# Patient Record
Sex: Female | Born: 1975 | Race: White | Hispanic: No | Smoking: Never smoker
Health system: Southern US, Community
[De-identification: ages and names within clinical notes are randomized; demographics above are authoritative.]

## PROBLEM LIST (undated history)

## (undated) DIAGNOSIS — R569 Unspecified convulsions: Secondary | ICD-10-CM

## (undated) DIAGNOSIS — G43909 Migraine, unspecified, not intractable, without status migrainosus: Secondary | ICD-10-CM

## (undated) DIAGNOSIS — D649 Anemia, unspecified: Secondary | ICD-10-CM

## (undated) HISTORY — PX: MANDIBLE FRACTURE SURGERY: SHX706

---

## 2019-11-08 ENCOUNTER — Encounter (HOSPITAL_COMMUNITY): Payer: Self-pay

## 2019-11-08 ENCOUNTER — Emergency Department (HOSPITAL_COMMUNITY): Payer: Medicaid Other

## 2019-11-08 ENCOUNTER — Other Ambulatory Visit: Payer: Self-pay

## 2019-11-08 ENCOUNTER — Inpatient Hospital Stay (HOSPITAL_COMMUNITY)
Admission: EM | Admit: 2019-11-08 | Discharge: 2019-11-10 | DRG: 812 | Disposition: A | Discharge status: Home / Self Care | Payer: Medicaid Other | Attending: Internal Medicine | Admitting: Internal Medicine

## 2019-11-08 DIAGNOSIS — R0789 Other chest pain: Secondary | ICD-10-CM | POA: Diagnosis present

## 2019-11-08 DIAGNOSIS — A5901 Trichomonal vulvovaginitis: Secondary | ICD-10-CM | POA: Diagnosis present

## 2019-11-08 DIAGNOSIS — R350 Frequency of micturition: Secondary | ICD-10-CM | POA: Diagnosis present

## 2019-11-08 DIAGNOSIS — D649 Anemia, unspecified: Secondary | ICD-10-CM | POA: Diagnosis not present

## 2019-11-08 DIAGNOSIS — G8929 Other chronic pain: Secondary | ICD-10-CM | POA: Diagnosis present

## 2019-11-08 DIAGNOSIS — Z79891 Long term (current) use of opiate analgesic: Secondary | ICD-10-CM

## 2019-11-08 DIAGNOSIS — Z6834 Body mass index (BMI) 34.0-34.9, adult: Secondary | ICD-10-CM

## 2019-11-08 DIAGNOSIS — E669 Obesity, unspecified: Secondary | ICD-10-CM | POA: Diagnosis present

## 2019-11-08 DIAGNOSIS — D500 Iron deficiency anemia secondary to blood loss (chronic): Principal | ICD-10-CM | POA: Diagnosis present

## 2019-11-08 DIAGNOSIS — R040 Epistaxis: Secondary | ICD-10-CM | POA: Diagnosis present

## 2019-11-08 DIAGNOSIS — G43909 Migraine, unspecified, not intractable, without status migrainosus: Secondary | ICD-10-CM | POA: Diagnosis present

## 2019-11-08 DIAGNOSIS — N920 Excessive and frequent menstruation with regular cycle: Secondary | ICD-10-CM

## 2019-11-08 DIAGNOSIS — Z20828 Contact with and (suspected) exposure to other viral communicable diseases: Secondary | ICD-10-CM | POA: Diagnosis present

## 2019-11-08 HISTORY — DX: Migraine, unspecified, not intractable, without status migrainosus: G43.909

## 2019-11-08 HISTORY — DX: Anemia, unspecified: D64.9

## 2019-11-08 HISTORY — DX: Unspecified convulsions: R56.9

## 2019-11-08 LAB — CBC WITH DIFFERENTIAL/PLATELET
Abs Immature Granulocytes: 0.05 10*3/uL (ref 0.00–0.07)
Basophils Absolute: 0 10*3/uL (ref 0.0–0.1)
Basophils Relative: 0 %
Eosinophils Absolute: 0.1 10*3/uL (ref 0.0–0.5)
Eosinophils Relative: 2 %
HCT: 22.4 % — ABNORMAL LOW (ref 36.0–46.0)
Hemoglobin: 6.2 g/dL — CL (ref 12.0–15.0)
Immature Granulocytes: 1 %
Lymphocytes Relative: 31 %
Lymphs Abs: 1.5 10*3/uL (ref 0.7–4.0)
MCH: 20.3 pg — ABNORMAL LOW (ref 26.0–34.0)
MCHC: 27.7 g/dL — ABNORMAL LOW (ref 30.0–36.0)
MCV: 73.4 fL — ABNORMAL LOW (ref 80.0–100.0)
Monocytes Absolute: 0.4 10*3/uL (ref 0.1–1.0)
Monocytes Relative: 9 %
Neutro Abs: 2.7 10*3/uL (ref 1.7–7.7)
Neutrophils Relative %: 57 %
Platelets: 143 10*3/uL — ABNORMAL LOW (ref 150–400)
RBC: 3.05 MIL/uL — ABNORMAL LOW (ref 3.87–5.11)
RDW: 17.7 % — ABNORMAL HIGH (ref 11.5–15.5)
WBC: 4.8 10*3/uL (ref 4.0–10.5)
nRBC: 0 % (ref 0.0–0.2)

## 2019-11-08 LAB — URINALYSIS, ROUTINE W REFLEX MICROSCOPIC
Bilirubin Urine: NEGATIVE
Glucose, UA: NEGATIVE mg/dL
Hgb urine dipstick: NEGATIVE
Ketones, ur: NEGATIVE mg/dL
Nitrite: NEGATIVE
Protein, ur: NEGATIVE mg/dL
Specific Gravity, Urine: 1.012 (ref 1.005–1.030)
pH: 5 (ref 5.0–8.0)

## 2019-11-08 LAB — COMPREHENSIVE METABOLIC PANEL
ALT: 27 U/L (ref 0–44)
AST: 38 U/L (ref 15–41)
Albumin: 3.9 g/dL (ref 3.5–5.0)
Alkaline Phosphatase: 56 U/L (ref 38–126)
Anion gap: 10 (ref 5–15)
BUN: 9 mg/dL (ref 6–20)
CO2: 29 mmol/L (ref 22–32)
Calcium: 9.1 mg/dL (ref 8.9–10.3)
Chloride: 101 mmol/L (ref 98–111)
Creatinine, Ser: 0.79 mg/dL (ref 0.44–1.00)
GFR calc Af Amer: >60 mL/min (ref >60)
GFR calc non Af Amer: >60 mL/min (ref >60)
Glucose, Bld: 93 mg/dL (ref 70–99)
Potassium: 3.7 mmol/L (ref 3.5–5.1)
Sodium: 140 mmol/L (ref 135–145)
Total Bilirubin: 0.7 mg/dL (ref 0.3–1.2)
Total Protein: 7.3 g/dL (ref 6.5–8.1)

## 2019-11-08 LAB — POC URINE PREG, ED: Preg Test, Ur: NEGATIVE

## 2019-11-08 LAB — WET PREP, GENITAL
Clue Cells Wet Prep HPF POC: NONE SEEN
Sperm: NONE SEEN
Yeast Wet Prep HPF POC: NONE SEEN

## 2019-11-08 LAB — RETICULOCYTES
Immature Retic Fract: 32.7 % — ABNORMAL HIGH (ref 2.3–15.9)
RBC.: 3.04 MIL/uL — ABNORMAL LOW (ref 3.87–5.11)
Retic Count, Absolute: 84.5 10*3/uL (ref 19.0–186.0)
Retic Ct Pct: 2.8 % (ref 0.4–3.1)

## 2019-11-08 LAB — LIPASE, BLOOD: Lipase: 32 U/L (ref 11–51)

## 2019-11-08 MED ORDER — SODIUM CHLORIDE 0.9 % IV SOLN
10.0000 mL/h | Freq: Once | INTRAVENOUS | Status: AC
  Administered 2019-11-09: 10 mL/h via INTRAVENOUS

## 2019-11-08 MED ORDER — METRONIDAZOLE 500 MG PO TABS
2000.0000 mg | ORAL_TABLET | Freq: Once | ORAL | Status: DC
  Filled 2019-11-08: qty 4

## 2019-11-08 MED ORDER — ACETAMINOPHEN 325 MG PO TABS
650.0000 mg | ORAL_TABLET | Freq: Once | ORAL | Status: AC
  Administered 2019-11-08: 650 mg via ORAL
  Filled 2019-11-08: qty 2

## 2019-11-08 NOTE — ED Notes (Signed)
Patient verbally aggressive during IV start and during collection of COVID specimen.

## 2019-11-08 NOTE — ED Provider Notes (Signed)
[facility]-EMERGENCY DEPT Provider Note   CSN: [id number] Arrival date & time: 11/08/19  1518     History Chief Complaint  Patient presents with  . Flank Pain  . Headache  . urinary frequency  . Dysuria    Stacy Fleming is a 43 y.o. female with a past medical history of anemia, migraines, chronic pain on opioids, who presents today for evaluation of multiple complaints. Her primary complaints are chest pain/pressure and foul vaginal odors.  She reports that she has been under increased stress the past week as she just came to [facility] to get out of a bad situation.  She states that she has been going to the methadone clinic instead of taking Percocets.  She states that she does not inject any medications.  She states that earlier today she had an episode of chest pressure where she felt like she could not breathe.  She does admit to feeling anxious.  She denies any associated diaphoresis, nausea, or vomiting.  She does not use any hormones, denies leg swelling.  No personal history of PE/DVT.  No hemoptysis.  She reports that currently she feels better and that her symptoms have resolved.  She reports that over the past week she has had foul vaginal odors with abnormal vaginal bleeding.  She is requesting a pelvic exam.  She reports she has had pelvic pain.  She also reports bilateral lower back pain with dysuria, increased frequency.  She had also mentioned nosebleeds nightly for the past 2 nights and migraine headaches over the past week to triage, however these are not her primary concerns today and she would rather have the chest pain and vaginal discharge addressed.     She notes that in the spring she tested positive for coronavirus and says this feels different.    HPI     Past Medical History:  Diagnosis Date  . Anemia   . Migraine   . Seizures (HCC)     There are no problems to display for this patient.   Past Surgical History:  Procedure  Laterality Date  . MANDIBLE FRACTURE SURGERY       OB History   No obstetric history on file.     Family History  Problem Relation Age of Onset  . Chronic Renal Failure Mother     Social History   Tobacco Use  . Smoking status: Never Smoker  . Smokeless tobacco: Never Used  Substance Use Topics  . Alcohol use: Yes  . Drug use: Never    Home Medications Prior to Admission medications   Not on File    Allergies    Patient has no known allergies.  Review of Systems   Review of Systems  Constitutional: Positive for fatigue. Negative for chills and fever.  HENT: Positive for congestion, nosebleeds and postnasal drip.   Eyes: Negative for photophobia, pain and visual disturbance.  Respiratory: Positive for chest tightness and shortness of breath.   Cardiovascular: Positive for chest pain. Negative for palpitations and leg swelling.  Gastrointestinal: Negative for diarrhea, nausea and vomiting.  Genitourinary: Positive for dysuria, menstrual problem, pelvic pain, urgency, vaginal bleeding, vaginal discharge and vaginal pain.  Musculoskeletal: Positive for back pain.  Skin: Negative for color change, rash and wound.  Neurological: Positive for headaches. Negative for weakness.  Psychiatric/Behavioral: Negative for confusion.  All other systems reviewed and are negative.   Physical Exam Updated Vital Signs BP (!) 136/45 (BP Location: Left Arm)   Pulse 79  Temp 98 F (36.7 C) (Oral)   Resp 18   Ht 5\' 2"  (1.575 m)   Wt 84.8 kg   LMP 10/25/2019   SpO2 100%   BMI 34.20 kg/m   Physical Exam Vitals and nursing note reviewed. Exam conducted with a chaperone present (Female ED tech).  Constitutional:      General: She is not in acute distress.    Appearance: She is well-developed. She is not diaphoretic.  HENT:     Head: Normocephalic and atraumatic.     Mouth/Throat:     Mouth: Mucous membranes are moist.  Eyes:     General: No scleral icterus.       Right  eye: No discharge.        Left eye: No discharge.     Conjunctiva/sclera: Conjunctivae normal.  Cardiovascular:     Rate and Rhythm: Normal rate and regular rhythm.     Pulses: Normal pulses.     Heart sounds: Normal heart sounds.  Pulmonary:     Effort: Pulmonary effort is normal. No respiratory distress.     Breath sounds: Normal breath sounds. No stridor.  Abdominal:     General: There is no distension.     Tenderness: There is no abdominal tenderness.  Genitourinary:    Comments: Normal external female genitalia.  There is copious amounts of watery discharge in the vaginal canal without evidence of bleeding.  No cervical motion tenderness or significant adnexal tenderness or fullness. Musculoskeletal:        General: No deformity.     Cervical back: Normal range of motion and neck supple.     Right lower leg: No edema.     Left lower leg: No edema.  Skin:    General: Skin is warm and dry.  Neurological:     General: No focal deficit present.     Mental Status: She is alert.     Cranial Nerves: No cranial nerve deficit.     Motor: No abnormal muscle tone.  Psychiatric:        Behavior: Behavior normal.     Comments: Very anxious     ED Results / Procedures / Treatments   Labs (all labs ordered are listed, but only abnormal results are displayed) Labs Reviewed  WET PREP, GENITAL - Abnormal; Notable for the following components:      Result Value   Trich, Wet Prep PRESENT (*)    WBC, Wet Prep HPF POC MANY (*)    All other components within normal limits  URINALYSIS, ROUTINE W REFLEX MICROSCOPIC - Abnormal; Notable for the following components:   APPearance HAZY (*)    Leukocytes,Ua LARGE (*)    Bacteria, UA RARE (*)    Crystals PRESENT (*)    All other components within normal limits  CBC WITH DIFFERENTIAL/PLATELET - Abnormal; Notable for the following components:   RBC 3.05 (*)    Hemoglobin 6.2 (*)    HCT 22.4 (*)    MCV 73.4 (*)    MCH 20.3 (*)    MCHC 27.7  (*)    RDW 17.7 (*)    Platelets 143 (*)    All other components within normal limits  RETICULOCYTES - Abnormal; Notable for the following components:   RBC. 3.04 (*)    Immature Retic Fract 32.7 (*)    All other components within normal limits  SARS CORONAVIRUS 2 (TAT 6-24 HRS)  COMPREHENSIVE METABOLIC PANEL  LIPASE, BLOOD  VITAMIN B12  FOLATE  IRON AND TIBC  FERRITIN  POC URINE PREG, ED  PREPARE RBC (CROSSMATCH)  GC/CHLAMYDIA PROBE AMP ([facility]) NOT AT [facility]    EKG EKG Interpretation  Date/Time:  Sunday November 08 2019 21:10:37 EST Ventricular Rate:  72 PR Interval:    QRS Duration: 84 QT Interval:  427 QTC Calculation: 468 R Axis:   67 Text Interpretation: Sinus rhythm No old tracing to compare Confirmed by Mancel Bale [phone]) on 11/08/2019 11:23:03 PM   Radiology DG Chest 2 View  Result Date: 11/08/2019 CLINICAL DATA:  Pt is here for RUQ chest pressure x 2 weeks, also c/o some left flank pain and dysuria. Nonsmoker. Pt had Covid x 3 months ago. EXAM: CHEST - 2 VIEW COMPARISON:  None. FINDINGS: The cardiomediastinal contours are within normal limits. Small linear opacity in the left mid lung likely reflects atelectasis or scarring. No other focal infiltrate. No pneumothorax or pleural effusion. No acute finding in the visualized skeleton. IMPRESSION: No evidence of active disease in the chest. Minimal left lung atelectasis/scarring. Electronically Signed   By: Emmaline Kluver M.D.   On: 11/08/2019 21:07    Procedures .Critical Care Performed by: Cristina Gong, PA-C Authorized by: Cristina Gong, PA-C   Critical care provider statement:    Critical care time (minutes):  45   Critical care was necessary to treat or prevent imminent or life-threatening deterioration of the following conditions:  Circulatory failure   Critical care was time spent personally by me on the following activities:  Discussions with consultants, evaluation of patient's  response to treatment, examination of patient, ordering and performing treatments and interventions, ordering and review of laboratory studies, ordering and review of radiographic studies, pulse oximetry, re-evaluation of patient's condition, obtaining history from patient or surrogate and review of old charts Comments:     Symptomatic anemia.  Blood transfusion ordered.   (including critical care time)  Medications Ordered in ED Medications  0.9 %  sodium chloride infusion (0 mL/hr Intravenous Hold 11/08/19 2256)  metroNIDAZOLE (FLAGYL) tablet 2,000 mg (has no administration in time range)  2 units RBC  ED Course  I have reviewed the triage vital signs and the nursing notes.  Pertinent labs & imaging results that were available during my care of the patient were reviewed by me and considered in my medical decision making (see chart for details).  Clinical Course as of Nov 07 2325  Wynelle Link Nov 08, 2019  2144 Blood ordered  Hemoglobin(!!): 6.2 [EH]  2308 Leroy Libman NP reports get ultrasound, if not bleeding would not do anything and can consult formally in the morning if requested.    [EH]    Clinical Course User Index [EH] Norman Clay   MDM Rules/Calculators/A&P                      Patient presents today for evaluation of multiple complaints including resolved chest pain/pressure, foul vaginal odors, nosebleeds and headaches.  Her primary concerns today are the foul vaginal odor/discharge along with the chest pain/pressure.  EKG obtained without evidence of ischemia, based on history low suspicion for ACS.  She has previously been diagnosed with Covid and recovered therefore very low suspicion for coronavirus.  Labs are obtained and reviewed, CBC is significant for hemoglobin is 6.2.  Given that she had chest pain earlier she meets criteria for symptomatic anemia.  We discussed role of blood transfusion and she states her understanding.  Blood transfusion was ordered.  CMP without significant abnormalities.  Wet prep is positive for trichomoniasis.  We discussed that this is a sexually transmitted infection and that partners will need to be treated.  She is given a one-time dose of 2 g of Flagyl for treatment after she denied any alcohol use in the past 24 hours.  I spoke with OB/GYN who states that as patient does not have blood in the vaginal canal and is not appear to be actively bleeding, while this may be related to her heavy periods, the only intervention they would recommend is an ultrasound and outpatient follow-up.  I spoke with hospitalist Dr. Posey Pronto who agreed to admit patient.  Patient remained hemodynamically stable while in my care. Final Clinical Impression(s) / ED Diagnoses Final diagnoses:  Anemia, unspecified type  Trichomonal vaginitis    Rx / DC Orders ED Discharge Orders    None       Ollen Gross 11/09/19 Prudence Davidson, MD 11/09/19 1249

## 2019-11-08 NOTE — H&P (Signed)
History and Physical    Stacy Fleming JQZ:[id number] DOB: 06-20-1976 DOA: 11/08/2019  PCP: Patient, No Pcp Per  Patient coming from: Home  I have personally briefly reviewed patient's old medical records in [place]  Chief Complaint: Symptomatic anemia  HPI: Stacy Fleming is a 43 y.o. female with medical history significant for menorrhagia, reported endometriosis, chronic pain due to degenerative disc disease who presents to the ED for evaluation of fatigue with lightheadedness and dizziness after a heavy menses cycle.  Patient states she has chronic heavy menses occurring about twice a month and lasting a week at a time.  She had heavier than usual menses which ended 6 days ago.  She has had associated lightheadedness and dizziness and reports an episode where she nearly passed out but was able to get herself into bed.  She also reports foul vaginal odor and discharge.  She reports taking up to 10 ibuprofen per day for chronic pain.  She states she is also taking methadone provided by her methadone clinic for chronic degenerative disc disease.  She is not taking any other medications.  ED Course:  Initial vitals show BP 124/67, pulse 83, RR 16, temp 97.8 Fahrenheit, SPO2 97% on room air.  Labs notable for hemoglobin 6.2, hematocrit 22.4, MCV 73.4, RDW 17.7, platelets 143,000, WBC 4.8, lipase 32, ferritin 3, iron 19, TIBC 561, saturation 3%, B12 398.  Urine pregnancy test is negative.  Urinalysis shows negative nitrites, large leukocytes, 0-5 RBCs, 21-50 WBCs, rare bacteria.  Wet prep is positive for trichomonas.  GC/Chlamydia test is ordered and pending.  2 view chest x-ray negative for focal consolidation, edema, or effusion.  Patient was ordered to receive transfusion of 2 units PRBCs.  EDP discussed case with on-call OB/GYN who recommended pelvic ultrasound no further intervention given bleeding has stopped.  Can call if needed in a.m. for formal consult.  The hospitalist  service was consulted admit for further evaluation management.  Review of Systems: All systems reviewed and are negative except as documented in history of present illness above.   Past Medical History:  Diagnosis Date  . Anemia   . Migraine   . Seizures ([address])     Past Surgical History:  Procedure Laterality Date  . MANDIBLE FRACTURE SURGERY      Social History:  reports that she has never smoked. She has never used smokeless tobacco. She reports current alcohol use. She reports that she does not use drugs.  No Known Allergies  Family History  Problem Relation Age of Onset  . Chronic Renal Failure Mother      Prior to Admission medications   Not on File    Physical Exam: Vitals:   11/08/19 2230 11/08/19 2300 11/08/19 2330 11/09/19 0000  BP: (!) 136/45 (!) 125/59 (!) 103/50 128/63  Pulse: 79 68 66 65  Resp: 18 11 12 13   Temp: 98 F (36.7 C)     TempSrc: Oral     SpO2: 100% 97% 98% 98%  Weight:      Height:        Constitutional: Obese woman resting supine in bed, NAD, calm, comfortable, appears tired Eyes: PERRL, lids and conjunctivae normal ENMT: Mucous membranes are moist. Posterior pharynx clear of any exudate or lesions.Normal dentition.  Neck: normal, supple, no masses. Respiratory: clear to auscultation bilaterally, no wheezing, no crackles. Normal respiratory effort. No accessory muscle use.  Cardiovascular: Regular rate and rhythm, no murmurs / rubs / gallops. No extremity edema. 2+ pedal  pulses. Abdomen: no tenderness, no masses palpated. No hepatosplenomegaly. Bowel sounds positive.  Musculoskeletal: no clubbing / cyanosis. No joint deformity upper and lower extremities. Good ROM, no contractures. Normal muscle tone.  Skin: Slightly pale complexion, no rashes, lesions, ulcers. No induration Neurologic: CN 2-12 grossly intact. Sensation intact, Strength 5/5 in all 4.  Psychiatric: Normal judgment and insight. Alert and oriented x 3. Normal mood.      Labs on Admission: I have personally reviewed following labs and imaging studies  CBC: Recent Labs  Lab 11/08/19 1949  WBC 4.8  NEUTROABS 2.7  HGB 6.2*  HCT 22.4*  MCV 73.4*  PLT 143*   Basic Metabolic Panel: Recent Labs  Lab 11/08/19 1949  NA 140  K 3.7  CL 101  CO2 29  GLUCOSE 93  BUN 9  CREATININE 0.79  CALCIUM 9.1   GFR: Estimated Creatinine Clearance: 90.7 mL/min (by C-G formula based on SCr of 0.79 mg/dL). Liver Function Tests: Recent Labs  Lab 11/08/19 1949  AST 38  ALT 27  ALKPHOS 56  BILITOT 0.7  PROT 7.3  ALBUMIN 3.9   Recent Labs  Lab 11/08/19 1949  LIPASE 32   No results for input(s): AMMONIA in the last 168 hours. Coagulation Profile: No results for input(s): INR, PROTIME in the last 168 hours. Cardiac Enzymes: No results for input(s): CKTOTAL, CKMB, CKMBINDEX, TROPONINI in the last 168 hours. BNP (last 3 results) No results for input(s): PROBNP in the last 8760 hours. HbA1C: No results for input(s): HGBA1C in the last 72 hours. CBG: No results for input(s): GLUCAP in the last 168 hours. Lipid Profile: No results for input(s): CHOL, HDL, LDLCALC, TRIG, CHOLHDL, LDLDIRECT in the last 72 hours. Thyroid Function Tests: No results for input(s): TSH, T4TOTAL, FREET4, T3FREE, THYROIDAB in the last 72 hours. Anemia Panel: Recent Labs    11/08/19 2232  VITAMINB12 398  FERRITIN 3*  TIBC 561*  IRON 19*  RETICCTPCT 2.8   Urine analysis:    Component Value Date/Time   COLORURINE YELLOW 11/08/2019 1547   APPEARANCEUR HAZY (A) 11/08/2019 1547   LABSPEC 1.012 11/08/2019 1547   PHURINE 5.0 11/08/2019 1547   GLUCOSEU NEGATIVE 11/08/2019 1547   HGBUR NEGATIVE 11/08/2019 1547   BILIRUBINUR NEGATIVE 11/08/2019 1547   KETONESUR NEGATIVE 11/08/2019 1547   PROTEINUR NEGATIVE 11/08/2019 1547   NITRITE NEGATIVE 11/08/2019 1547   LEUKOCYTESUR LARGE (A) 11/08/2019 1547    Radiological Exams on Admission: DG Chest 2 View  Result Date:  11/08/2019 CLINICAL DATA:  Pt is here for RUQ chest pressure x 2 weeks, also c/o some left flank pain and dysuria. Nonsmoker. Pt had Covid x 3 months ago. EXAM: CHEST - 2 VIEW COMPARISON:  None. FINDINGS: The cardiomediastinal contours are within normal limits. Small linear opacity in the left mid lung likely reflects atelectasis or scarring. No other focal infiltrate. No pneumothorax or pleural effusion. No acute finding in the visualized skeleton. IMPRESSION: No evidence of active disease in the chest. Minimal left lung atelectasis/scarring. Electronically Signed   By: Emmaline KluverNancy  Ballantyne M.D.   On: 11/08/2019 21:07   US PELVIC COMPLETE W TRANSVAGINAL AND TORSION R/O  Result Date: 11/09/2019 CLINICAL DATA:  Initial evaluation for anemia, vaginal bleeding for 2 weeks. EXAM: TRANSABDOMINAL AND TRANSVAGINAL ULTRASOUND OF PELVIS DOPPLER ULTRASOUND OF OVARIES TECHNIQUE: Both transabdominal and transvaginal ultrasound examinations of the pelvis were performed. Transabdominal technique was performed for global imaging of the pelvis including uterus, ovaries, adnexal regions, and pelvic cul-de-sac. It was necessary  to proceed with endovaginal exam following the transabdominal exam to visualize the uterus, endometrium, and ovaries. Color and duplex Doppler ultrasound was utilized to evaluate blood flow to the ovaries. COMPARISON:  None available. FINDINGS: Uterus Measurements: 11.1 x 5.5 x 6.1 cm = volume: 194.5 mL. No fibroids or other mass visualized. Endometrium Thickness: 13.9 mm. Endometrial complex is somewhat heterogeneous in appearance without discrete or focal mass. Right ovary Measurements: 1.8 x 1.6 x 1.8 cm = volume: 2.7 mL. 3.1 x 1.4 x 1.3 cm somewhat tubular anechoic structure within the right adnexa is seen, suspected to reflect a hydrosalpinx. Left ovary Not visualized.  No adnexal mass. Pulsed Doppler evaluation of the right ovary demonstrates normal low-resistance arterial and venous waveforms. Other  findings Trace free fluid seen within the pelvis, presumably physiologic. IMPRESSION: 1. Endometrial stripe measures 13.9 mm in thickness. If bleeding remains unresponsive to hormonal or medical therapy, sonohysterogram should be considered for focal lesion work-up. (Ref: Radiological Reasoning: Algorithmic Workup of Abnormal Vaginal Bleeding with Endovaginal Sonography and Sonohysterography. AJR 2008; 462:V03-50). 2. Otherwise normal sonographic appearance of the uterus. No discrete fibroid or other abnormality. 3. 3.1 cm tubular cystic structure within the right adnexa, most characteristic of a small hydrosalpinx. 4. Otherwise normal right ovary.  No evidence for torsion. 5. Nonvisualization of the left ovary.  No left adnexal mass. Electronically Signed   By: Rise Mu M.D.   On: 11/09/2019 00:12    EKG: Independently reviewed. Sinus rhythm without acute ischemic changes.  No prior for comparison.  Assessment/Plan Principal Problem:   Symptomatic anemia Active Problems:   Menorrhagia  Stacy Fleming is a 43 y.o. female with medical history significant for menorrhagia, reported endometriosis, chronic pain due to degenerative disc disease who is admitted with symptomatic anemia due to menorrhagia.  Symptomatic anemia due to menorrhagia: -Has not had further bleeding for several days -Transfusing 2 units PRBCs -Pelvic ultrasound shows endometrial stripe measuring 13.9 mm in thickness, 3.1 cm tubular cystic structure in the right adnexa, left ovary not visualized  Iron deficiency anemia: Ferritin 3, iron 19, TIBC 561, saturation 3%. -Give a dose of IV Feraheme  Trichomoniasis: -Give metronidazole 2000 mg orally  Degenerative disc disease with chronic pain: -Continue home methadone  DVT prophylaxis: SCDs Code Status: Full code, confirmed with patient Family Communication: Discussed with patient, she has discussed with family Disposition Plan: Likely discharge to home in 1-2  days Consults called: EDP discussed with on-call OB/GYN Admission status: Observation   Darreld Mclean MD [facility]  If 7PM-7AM, please contact night-coverage [email or web address]  11/09/2019, 12:27 AM

## 2019-11-08 NOTE — ED Triage Notes (Addendum)
Patient states that she had vaginal bleeding x 2 weeks.  Patient c/o left flank pain, dysuria, and frequency x 2 weeks. Patient also states that she has a foul vaginal odor. Patient also c/o migraine headache which she states she has a history of. Patient also c/o nose bleed nightly x 3 nights    Patient fell asleep several times in triage. Writer had to wake her up to answer questions.

## 2019-11-08 NOTE — ED Notes (Signed)
Date and time results received: 11/08/19 9:43 PM    Test: Hemoglobin Critical Value: 6.2  Name of Provider Notified: Wyn Quaker, PA  Orders Received? Or Actions Taken?:

## 2019-11-08 NOTE — ED Notes (Signed)
Attempted IV start; patient c/o pain yelled at me to remove IV catheter. After removal of IV catheter pt said "if it was in you should have left it in there".

## 2019-11-09 DIAGNOSIS — N920 Excessive and frequent menstruation with regular cycle: Secondary | ICD-10-CM

## 2019-11-09 DIAGNOSIS — D649 Anemia, unspecified: Secondary | ICD-10-CM | POA: Diagnosis not present

## 2019-11-09 DIAGNOSIS — Z20828 Contact with and (suspected) exposure to other viral communicable diseases: Secondary | ICD-10-CM | POA: Diagnosis present

## 2019-11-09 DIAGNOSIS — E669 Obesity, unspecified: Secondary | ICD-10-CM | POA: Diagnosis present

## 2019-11-09 DIAGNOSIS — D500 Iron deficiency anemia secondary to blood loss (chronic): Principal | ICD-10-CM

## 2019-11-09 DIAGNOSIS — G8929 Other chronic pain: Secondary | ICD-10-CM | POA: Diagnosis present

## 2019-11-09 DIAGNOSIS — G43909 Migraine, unspecified, not intractable, without status migrainosus: Secondary | ICD-10-CM | POA: Diagnosis present

## 2019-11-09 DIAGNOSIS — Z79891 Long term (current) use of opiate analgesic: Secondary | ICD-10-CM | POA: Diagnosis not present

## 2019-11-09 DIAGNOSIS — R0789 Other chest pain: Secondary | ICD-10-CM | POA: Diagnosis present

## 2019-11-09 DIAGNOSIS — Z6834 Body mass index (BMI) 34.0-34.9, adult: Secondary | ICD-10-CM | POA: Diagnosis not present

## 2019-11-09 DIAGNOSIS — R350 Frequency of micturition: Secondary | ICD-10-CM | POA: Diagnosis present

## 2019-11-09 DIAGNOSIS — R040 Epistaxis: Secondary | ICD-10-CM | POA: Diagnosis present

## 2019-11-09 DIAGNOSIS — A5901 Trichomonal vulvovaginitis: Secondary | ICD-10-CM | POA: Diagnosis not present

## 2019-11-09 LAB — BASIC METABOLIC PANEL
Anion gap: 9 (ref 5–15)
BUN: 9 mg/dL (ref 6–20)
CO2: 29 mmol/L (ref 22–32)
Calcium: 9 mg/dL (ref 8.9–10.3)
Chloride: 102 mmol/L (ref 98–111)
Creatinine, Ser: 0.86 mg/dL (ref 0.44–1.00)
GFR calc Af Amer: >60 mL/min (ref >60)
GFR calc non Af Amer: >60 mL/min (ref >60)
Glucose, Bld: 84 mg/dL (ref 70–99)
Potassium: 4.1 mmol/L (ref 3.5–5.1)
Sodium: 140 mmol/L (ref 135–145)

## 2019-11-09 LAB — CBC
HCT: 28.1 % — ABNORMAL LOW (ref 36.0–46.0)
Hemoglobin: 8.1 g/dL — ABNORMAL LOW (ref 12.0–15.0)
MCH: 22.1 pg — ABNORMAL LOW (ref 26.0–34.0)
MCHC: 28.8 g/dL — ABNORMAL LOW (ref 30.0–36.0)
MCV: 76.6 fL — ABNORMAL LOW (ref 80.0–100.0)
Platelets: 131 10*3/uL — ABNORMAL LOW (ref 150–400)
RBC: 3.67 MIL/uL — ABNORMAL LOW (ref 3.87–5.11)
RDW: 18.4 % — ABNORMAL HIGH (ref 11.5–15.5)
WBC: 4.4 10*3/uL (ref 4.0–10.5)
nRBC: 0.7 % — ABNORMAL HIGH (ref 0.0–0.2)

## 2019-11-09 LAB — FERRITIN: Ferritin: 3 ng/mL — ABNORMAL LOW (ref 11–307)

## 2019-11-09 LAB — IRON AND TIBC
Iron: 19 ug/dL — ABNORMAL LOW (ref 28–170)
Saturation Ratios: 3 % — ABNORMAL LOW (ref 10.4–31.8)
TIBC: 561 ug/dL — ABNORMAL HIGH (ref 250–450)
UIBC: 542 ug/dL

## 2019-11-09 LAB — ABO/RH: ABO/RH(D): O POS

## 2019-11-09 LAB — SARS CORONAVIRUS 2 (TAT 6-24 HRS): SARS Coronavirus 2: NEGATIVE

## 2019-11-09 LAB — VITAMIN B12: Vitamin B-12: 398 pg/mL (ref 180–914)

## 2019-11-09 LAB — FOLATE: Folate: 12.4 ng/mL (ref >5.9)

## 2019-11-09 LAB — PREPARE RBC (CROSSMATCH)

## 2019-11-09 LAB — HIV ANTIBODY (ROUTINE TESTING W REFLEX): HIV Screen 4th Generation wRfx: NONREACTIVE

## 2019-11-09 MED ORDER — ONDANSETRON HCL 4 MG PO TABS
4.0000 mg | ORAL_TABLET | Freq: Four times a day (QID) | ORAL | Status: DC | PRN
  Administered 2019-11-09: 4 mg via ORAL
  Filled 2019-11-09: qty 1

## 2019-11-09 MED ORDER — METHADONE HCL 10 MG/ML PO CONC
45.0000 mg | Freq: Every day | ORAL | Status: DC
  Administered 2019-11-09 – 2019-11-10 (×2): 45 mg via ORAL
  Filled 2019-11-09 (×2): qty 4.5

## 2019-11-09 MED ORDER — ACETAMINOPHEN 650 MG RE SUPP: 650.0000 mg | Freq: Four times a day (QID) | RECTAL | Status: DC | PRN

## 2019-11-09 MED ORDER — SODIUM CHLORIDE 0.9 % IV SOLN
510.0000 mg | Freq: Once | INTRAVENOUS | Status: AC
  Administered 2019-11-09: 510 mg via INTRAVENOUS
  Filled 2019-11-09: qty 17

## 2019-11-09 MED ORDER — ONDANSETRON HCL 4 MG/2ML IJ SOLN: 4.0000 mg | Freq: Four times a day (QID) | INTRAMUSCULAR | Status: DC | PRN

## 2019-11-09 MED ORDER — DIPHENHYDRAMINE HCL 25 MG PO CAPS
25.0000 mg | ORAL_CAPSULE | Freq: Every evening | ORAL | Status: DC | PRN
  Administered 2019-11-09: 25 mg via ORAL
  Filled 2019-11-09: qty 1

## 2019-11-09 MED ORDER — VITAMIN B-12 1000 MCG PO TABS
1000.0000 ug | ORAL_TABLET | Freq: Every day | ORAL | Status: DC
  Administered 2019-11-09 – 2019-11-10 (×2): 1000 ug via ORAL
  Filled 2019-11-09 (×2): qty 1

## 2019-11-09 MED ORDER — ACETAMINOPHEN 325 MG PO TABS
650.0000 mg | ORAL_TABLET | Freq: Four times a day (QID) | ORAL | Status: DC | PRN
  Administered 2019-11-09 – 2019-11-10 (×3): 650 mg via ORAL
  Filled 2019-11-09 (×3): qty 2

## 2019-11-09 NOTE — Progress Notes (Addendum)
Patient refused to have lab draw her blood for an H&H. Dr. Horris Latino notified.

## 2019-11-09 NOTE — Progress Notes (Signed)
PROGRESS NOTE  Stacy Fleming XBD:[id number] DOB: [birth year] DOA: 11/08/2019 PCP: Patient, No Pcp Per  HPI/Recap of past 24 hours: HPI from Dr Gennaro Africa is a 43 y.o. female with medical history significant for menorrhagia, reported endometriosis, chronic pain due to degenerative disc disease who presents to the ED for evaluation of fatigue, lightheadedness and dizziness after a heavy menses cycle. Patient states she has chronic heavy menses occurring about twice a month and lasting a week at a time.  She had heavier than usual menses which ended 6 days ago.  She has had associated lightheadedness and dizziness and reports an episode where she nearly passed out but was able to get herself into bed.  She also reports foul vaginal odor and discharge. She reports taking up to 10 ibuprofen per day for chronic pain.  She states she is also taking methadone provided by her methadone clinic for chronic degenerative disc disease. In the ED, VSS, labs showed hemoglobin 6.2, ferritin 3, iron 19, TIBC 561, saturation 3%, B12 398. Blood transfusion was started. EDP discussed case with on-call OB/GYN who recommended pelvic ultrasound no further intervention given bleeding has stopped. Pt admitted for further management.    Today, pt still reports generalized weakness, still feeling very fatigued. Denies any worsening SOB, chest pain.     Assessment/Plan: Principal Problem:   Symptomatic anemia Active Problems:   Menorrhagia   Symptomatic anemia due to menorrhagia/iron def anemia Currently bleeding has stopped prior to admission Hemoglobin 6.2 on admission, status post 2 units of PRBC Anemia panel showed ferritin 3, iron 19, TIBC 561, saturation 3%, status post IV Feraheme Pelvic ultrasound showed endometrial stripe measuring 13.9 mm in thickness, 3.1 cm tubular cystic structure in the right adnexa, left ovary not visualized Patient to follow-up with outpatient PCP, case manager to set up  appointment Patient will need to shop for a new OB/GYN, as an outpatient, patient verbalized understanding DC on oral supplemental iron Daily CBC  Trichomoniasis Status post metronidazole 2000 mg orally  Degenerative disc disease with chronic pain Continue home methadone  Obesity Lifestyle modification advised       Malnutrition Type:      Malnutrition Characteristics:      Nutrition Interventions:       Estimated body mass index is 34.2 kg/m as calculated from the following:   Height as of this encounter: 5\' 2"  (1.575 m).   Weight as of this encounter: 84.8 kg.     Code Status: Full  Family Communication: None at bedside  Disposition Plan: Likely home on 11/10/2019   Consultants:  EDP spoke to OB/GYN  Procedures:  None  Antimicrobials:  Metronidazole  DVT prophylaxis: SCDs   Objective: Vitals:   11/09/19 1551 11/09/19 1553 11/09/19 1558 11/09/19 1559  BP: (!) 142/80 140/67 (!) 146/75 139/78  Pulse: 65 69 70 68  Resp: 17 16 17 18   Temp:      TempSrc:      SpO2: 96% 96% 95% 95%  Weight:      Height:        Intake/Output Summary (Last 24 hours) at 11/09/2019 1803 Last data filed at 11/09/2019 0930 Gross per 24 hour  Intake 570 ml  Output --  Net 570 ml   Filed Weights   11/08/19 1542  Weight: 84.8 kg    Exam:  General: NAD   Cardiovascular: S1, S2 present  Respiratory: CTAB  Abdomen: Soft, nontender, nondistended, bowel sounds present  Musculoskeletal: No bilateral pedal edema noted  Skin: Normal  Psychiatry: Normal mood   Data Reviewed: CBC: Recent Labs  Lab 11/08/19 1949 11/09/19 1139  WBC 4.8 4.4  NEUTROABS 2.7  --   HGB 6.2* 8.1*  HCT 22.4* 28.1*  MCV 73.4* 76.6*  PLT 143* 131*   Basic Metabolic Panel: Recent Labs  Lab 11/08/19 1949 11/09/19 1139  NA 140 140  K 3.7 4.1  CL 101 102  CO2 29 29  GLUCOSE 93 84  BUN 9 9  CREATININE 0.79 0.86  CALCIUM 9.1 9.0   GFR: Estimated Creatinine  Clearance: 84.3 mL/min (by C-G formula based on SCr of 0.86 mg/dL). Liver Function Tests: Recent Labs  Lab 11/08/19 1949  AST 38  ALT 27  ALKPHOS 56  BILITOT 0.7  PROT 7.3  ALBUMIN 3.9   Recent Labs  Lab 11/08/19 1949  LIPASE 32   No results for input(s): AMMONIA in the last 168 hours. Coagulation Profile: No results for input(s): INR, PROTIME in the last 168 hours. Cardiac Enzymes: No results for input(s): CKTOTAL, CKMB, CKMBINDEX, TROPONINI in the last 168 hours. BNP (last 3 results) No results for input(s): PROBNP in the last 8760 hours. HbA1C: No results for input(s): HGBA1C in the last 72 hours. CBG: No results for input(s): GLUCAP in the last 168 hours. Lipid Profile: No results for input(s): CHOL, HDL, LDLCALC, TRIG, CHOLHDL, LDLDIRECT in the last 72 hours. Thyroid Function Tests: No results for input(s): TSH, T4TOTAL, FREET4, T3FREE, THYROIDAB in the last 72 hours. Anemia Panel: Recent Labs    11/08/19 2232  VITAMINB12 398  FOLATE 12.4  FERRITIN 3*  TIBC 561*  IRON 19*  RETICCTPCT 2.8   Urine analysis:    Component Value Date/Time   COLORURINE YELLOW 11/08/2019 1547   APPEARANCEUR HAZY (A) 11/08/2019 1547   LABSPEC 1.012 11/08/2019 1547   PHURINE 5.0 11/08/2019 1547   GLUCOSEU NEGATIVE 11/08/2019 1547   HGBUR NEGATIVE 11/08/2019 1547   BILIRUBINUR NEGATIVE 11/08/2019 1547   KETONESUR NEGATIVE 11/08/2019 1547   PROTEINUR NEGATIVE 11/08/2019 1547   NITRITE NEGATIVE 11/08/2019 1547   LEUKOCYTESUR LARGE (A) 11/08/2019 1547   Sepsis Labs: @LABRCNTIP (procalcitonin:4,lacticidven:4)  ) Recent Results (from the past 240 hour(s))  Wet prep, genital     Status: Abnormal   Collection Time: 11/08/19 10:00 PM   Specimen: PATH Cytology Cervicovaginal Ancillary Only  Result Value Ref Range Status   Yeast Wet Prep HPF POC NONE SEEN NONE SEEN Final   Trich, Wet Prep PRESENT (A) NONE SEEN Final   Clue Cells Wet Prep HPF POC NONE SEEN NONE SEEN Final   WBC,  Wet Prep HPF POC MANY (A) NONE SEEN Final   Sperm NONE SEEN  Final    Comment: Performed at [facility], [address], [place], [place]  SARS CORONAVIRUS 2 (TAT 6-24 HRS) Nasopharyngeal Nasopharyngeal Swab     Status: None   Collection Time: 11/08/19 10:32 PM   Specimen: Nasopharyngeal Swab  Result Value Ref Range Status   SARS Coronavirus 2 NEGATIVE NEGATIVE Final    Comment: (NOTE) SARS-CoV-2 target nucleic acids are NOT DETECTED. The SARS-CoV-2 RNA is generally detectable in upper and lower respiratory specimens during the acute phase of infection. Negative results do not preclude SARS-CoV-2 infection, do not rule out co-infections with other pathogens, and should not be used as the sole basis for treatment or other patient management decisions. Negative results must be combined with clinical observations, patient history, and epidemiological information. The expected result is Negative. Fact  Sheet for Patients: HairSlick.nohttps://www.fda.gov/media/138098/download Fact Sheet for Healthcare Providers: quierodirigir.comhttps://www.fda.gov/media/138095/download This test is not yet approved or cleared by the [place] FDA and  has been authorized for detection and/or diagnosis of SARS-CoV-2 by FDA under an Emergency Use Authorization (EUA). This EUA will remain  in effect (meaning this test can be used) for the duration of the COVID-19 declaration under Section 56 4(b)(1) of the Act, 21 U.S.C. section 360bbb-3(b)(1), unless the authorization is terminated or revoked sooner. Performed at [facility], [address] [place], [place] [address]       Studies: DG Chest 2 View  Result Date: 11/08/2019 CLINICAL DATA:  Pt is here for RUQ chest pressure x 2 weeks, also c/o some left flank pain and dysuria. Nonsmoker. Pt had Covid x 3 months ago. EXAM: CHEST - 2 VIEW COMPARISON:  None. FINDINGS: The cardiomediastinal contours are within normal limits. Small linear opacity  in the left mid lung likely reflects atelectasis or scarring. No other focal infiltrate. No pneumothorax or pleural effusion. No acute finding in the visualized skeleton. IMPRESSION: No evidence of active disease in the chest. Minimal left lung atelectasis/scarring. Electronically Signed   By: Emmaline KluverNancy  Ballantyne M.D.   On: 11/08/2019 21:07   US PELVIC COMPLETE W TRANSVAGINAL AND TORSION R/O  Result Date: 11/09/2019 CLINICAL DATA:  Initial evaluation for anemia, vaginal bleeding for 2 weeks. EXAM: TRANSABDOMINAL AND TRANSVAGINAL ULTRASOUND OF PELVIS DOPPLER ULTRASOUND OF OVARIES TECHNIQUE: Both transabdominal and transvaginal ultrasound examinations of the pelvis were performed. Transabdominal technique was performed for global imaging of the pelvis including uterus, ovaries, adnexal regions, and pelvic cul-de-sac. It was necessary to proceed with endovaginal exam following the transabdominal exam to visualize the uterus, endometrium, and ovaries. Color and duplex Doppler ultrasound was utilized to evaluate blood flow to the ovaries. COMPARISON:  None available. FINDINGS: Uterus Measurements: 11.1 x 5.5 x 6.1 cm = volume: 194.5 mL. No fibroids or other mass visualized. Endometrium Thickness: 13.9 mm. Endometrial complex is somewhat heterogeneous in appearance without discrete or focal mass. Right ovary Measurements: 1.8 x 1.6 x 1.8 cm = volume: 2.7 mL. 3.1 x 1.4 x 1.3 cm somewhat tubular anechoic structure within the right adnexa is seen, suspected to reflect a hydrosalpinx. Left ovary Not visualized.  No adnexal mass. Pulsed Doppler evaluation of the right ovary demonstrates normal low-resistance arterial and venous waveforms. Other findings Trace free fluid seen within the pelvis, presumably physiologic. IMPRESSION: 1. Endometrial stripe measures 13.9 mm in thickness. If bleeding remains unresponsive to hormonal or medical therapy, sonohysterogram should be considered for focal lesion work-up. (Ref:  Radiological Reasoning: Algorithmic Workup of Abnormal Vaginal Bleeding with Endovaginal Sonography and Sonohysterography. AJR 2008; 604:V40-98; 191:S68-73). 2. Otherwise normal sonographic appearance of the uterus. No discrete fibroid or other abnormality. 3. 3.1 cm tubular cystic structure within the right adnexa, most characteristic of a small hydrosalpinx. 4. Otherwise normal right ovary.  No evidence for torsion. 5. Nonvisualization of the left ovary.  No left adnexal mass. Electronically Signed   By: Rise MuBenjamin  McClintock M.D.   On: 11/09/2019 00:12    Scheduled Meds: . methadone  45 mg Oral Daily  . metroNIDAZOLE  2,000 mg Oral Once    Continuous Infusions:   LOS: 0 days     Briant CedarNkeiruka J Mackenzi Krogh, MD [facility]  If 7PM-7AM, please contact night-coverage [email or web address] 11/09/2019, 6:03 PM

## 2019-11-09 NOTE — Progress Notes (Signed)
SATURATION QUALIFICATIONS: (This note is used to comply with regulatory documentation for home oxygen)  Patient Saturations on Room Air at Rest = 96%  Patient Saturations on Room Air while Ambulating = 95%  Patient Saturations on 0Liters of oxygen while Ambulating = 95%  Please briefly explain why patient needs home oxygen: patient tolerating fine without O2.

## 2019-11-10 LAB — TYPE AND SCREEN
ABO/RH(D): O POS
Antibody Screen: NEGATIVE
Unit division: 0
Unit division: 0

## 2019-11-10 LAB — BPAM RBC
Blood Product Expiration Date: 202101122359
Blood Product Expiration Date: 202101122359
ISSUE DATE / TIME: 202012140227
ISSUE DATE / TIME: 202012140509
Unit Type and Rh: 5100
Unit Type and Rh: 5100

## 2019-11-10 LAB — GC/CHLAMYDIA PROBE AMP (~~LOC~~) NOT AT ARMC
Chlamydia: NEGATIVE
Neisseria Gonorrhea: NEGATIVE

## 2019-11-10 MED ORDER — ACETAMINOPHEN 325 MG PO TABS: 650.0000 mg | ORAL_TABLET | Freq: Four times a day (QID) | ORAL | Status: AC | PRN

## 2019-11-10 MED ORDER — FERROUS SULFATE 325 (65 FE) MG PO TABS: 325.0000 mg | ORAL_TABLET | Freq: Two times a day (BID) | ORAL | 0 refills | Status: AC

## 2019-11-10 NOTE — Discharge Summary (Signed)
Physician Discharge Summary  Stacy Fleming VOJ:[id number] DOB: 1976-01-27 DOA: 11/08/2019  PCP: Patient, No Pcp Per  Admit date: 11/08/2019 Discharge date: 11/10/2019  Admitted From: Home  Disposition:  Home  Recommendations for Outpatient Follow-up:  1. Follow up with GYN in 1-2 weeks 2. Please obtain CBC at the time of GYN visit.    Home Health: No Equipment/Devices: No  Discharge Condition: Stable CODE STATUS: Full Diet recommendation: Regular    Brief/Interim Summary: HPI from Dr Stacy Fleming a 43 y.o.femalewith medical history significant formenorrhagia,reportedendometriosis, chronic pain due to degenerative disc disease who presents to the ED for evaluation of fatigue, lightheadedness and dizziness after a heavy menses cycle. Patient states she has chronic heavy menses occurring about twice a monthand lasting a week at a time. She had heavier than usual menses which ended 6 days ago. She has had associated lightheadedness and dizziness and reports an episode where she nearly passed out but was able to get herself into bed. She also reports foul vaginal odor and discharge. She reports taking up to 10 ibuprofen per day for chronic pain. She states she is also taking methadone provided by her methadone clinic for chronic degenerative disc disease. In the ED, VSS, labs showed hemoglobin 6.2, ferritin 3, iron 19, TIBC 561, saturation 3%, B12 398. Blood transfusion was started. EDP discussed case with on-call OB/GYN who recommended pelvic ultrasound no further intervention given bleeding has stopped. Pt admitted for further management.    Today, pt still reports generalized weakness, still feeling very fatigued. Denies any worsening SOB, chest pain.  Hospital Course:  Symptomatic anemia due to menorrhagia/iron def anemia Currently bleeding has stopped prior to admission Hemoglobin 6.2 on admission, status post 2 units of PRBC, Hgb stable at 8.1 prior to  discharge.  Anemia panel showed ferritin 3, iron 19, TIBC 561, saturation 3%, status post IV Feraheme Pelvic ultrasound showed endometrial stripe measuring 13.9 mm in thickness, 3.1 cm tubular cystic structure in the right adnexa, left ovary not visualized Patient to follow-up with outpatient PCP, case manager to set up appointment Patient will need to shop for a new OB/GYN, as an outpatient, patient verbalized understanding. DC on oral supplemental iron, ferrous sulfate 325mg  po TID w meals.  Needs repeat CBC at the time of follow up visit.   Trichomoniasis Status post metronidazole 2000 mg orally  Degenerative disc disease with chronic pain Continue home methadone  Obesity Lifestyle modification advised   Discharge Diagnoses:  Principal Problem:   Symptomatic anemia Active Problems:   Menorrhagia    Discharge Instructions  Discharge Instructions    Diet - low sodium heart healthy   Complete by: As directed    Increase activity slowly   Complete by: As directed      Allergies as of 11/10/2019   No Known Allergies     Medication List    TAKE these medications   acetaminophen 325 MG tablet Commonly known as: TYLENOL Take 2 tablets (650 mg total) by mouth every 6 (six) hours as needed for mild pain (or Fever >/= 101).   ferrous sulfate 325 (65 FE) MG tablet Take 1 tablet (325 mg total) by mouth 2 (two) times daily with a meal.   methadone 10 MG/5ML solution Commonly known as: DOLOPHINE Take 45 mg by mouth daily.      Follow-up Information    [facility] Follow up.   Why: call for appointment. They take Medicaid and uninsured patients. You can establish a  primary care doctor here. They have a pharmacy and financial counselors. Contact information: [place] [address] [phone]         No Known Allergies  Consultations:  None   Procedures/Studies: DG Chest 2 View  Result  Date: 11/08/2019 CLINICAL DATA:  Pt is here for RUQ chest pressure x 2 weeks, also c/o some left flank pain and dysuria. Nonsmoker. Pt had Covid x 3 months ago. EXAM: CHEST - 2 VIEW COMPARISON:  None. FINDINGS: The cardiomediastinal contours are within normal limits. Small linear opacity in the left mid lung likely reflects atelectasis or scarring. No other focal infiltrate. No pneumothorax or pleural effusion. No acute finding in the visualized skeleton. IMPRESSION: No evidence of active disease in the chest. Minimal left lung atelectasis/scarring. Electronically Signed   By: Audie Pinto M.D.   On: 11/08/2019 21:07   US PELVIC COMPLETE W TRANSVAGINAL AND TORSION R/O  Result Date: 11/09/2019 CLINICAL DATA:  Initial evaluation for anemia, vaginal bleeding for 2 weeks. EXAM: TRANSABDOMINAL AND TRANSVAGINAL ULTRASOUND OF PELVIS DOPPLER ULTRASOUND OF OVARIES TECHNIQUE: Both transabdominal and transvaginal ultrasound examinations of the pelvis were performed. Transabdominal technique was performed for global imaging of the pelvis including uterus, ovaries, adnexal regions, and pelvic cul-de-sac. It was necessary to proceed with endovaginal exam following the transabdominal exam to visualize the uterus, endometrium, and ovaries. Color and duplex Doppler ultrasound was utilized to evaluate blood flow to the ovaries. COMPARISON:  None available. FINDINGS: Uterus Measurements: 11.1 x 5.5 x 6.1 cm = volume: 194.5 mL. No fibroids or other mass visualized. Endometrium Thickness: 13.9 mm. Endometrial complex is somewhat heterogeneous in appearance without discrete or focal mass. Right ovary Measurements: 1.8 x 1.6 x 1.8 cm = volume: 2.7 mL. 3.1 x 1.4 x 1.3 cm somewhat tubular anechoic structure within the right adnexa is seen, suspected to reflect a hydrosalpinx. Left ovary Not visualized.  No adnexal mass. Pulsed Doppler evaluation of the right ovary demonstrates normal low-resistance arterial and venous waveforms.  Other findings Trace free fluid seen within the pelvis, presumably physiologic. IMPRESSION: 1. Endometrial stripe measures 13.9 mm in thickness. If bleeding remains unresponsive to hormonal or medical therapy, sonohysterogram should be considered for focal lesion work-up. (Ref: Radiological Reasoning: Algorithmic Workup of Abnormal Vaginal Bleeding with Endovaginal Sonography and Sonohysterography. AJR 2008; 540:G86-76). 2. Otherwise normal sonographic appearance of the uterus. No discrete fibroid or other abnormality. 3. 3.1 cm tubular cystic structure within the right adnexa, most characteristic of a small hydrosalpinx. 4. Otherwise normal right ovary.  No evidence for torsion. 5. Nonvisualization of the left ovary.  No left adnexal mass. Electronically Signed   By: Jeannine Boga M.D.   On: 11/09/2019 00:12        Subjective: She reports feeling better. No abdominal cramps. No recurrent bleeding.   Discharge Exam: Vitals:   11/09/19 2015 11/10/19 0550  BP: (!) 154/79 (!) 149/76  Pulse: 60 (!) 56  Resp: 18 16  Temp: 97.9 F (36.6 C) 97.9 F (36.6 C)  SpO2: 96% 98%   Vitals:   11/09/19 1558 11/09/19 1559 11/09/19 2015 11/10/19 0550  BP: (!) 146/75 139/78 (!) 154/79 (!) 149/76  Pulse: 70 68 60 (!) 56  Resp: 17 18 18 16   Temp:   97.9 F (36.6 C) 97.9 F (36.6 C)  TempSrc:   Oral Oral  SpO2: 95% 95% 96% 98%  Weight:      Height:        General: Pt is  alert, awake, not in acute distress Cardiovascular: RRR Respiratory: No respiratory distress Abdominal: Soft, NT, ND Extremities: no edema, no cyanosis    The results of significant diagnostics from this hospitalization (including imaging, microbiology, ancillary and laboratory) are listed below for reference.     Microbiology: Recent Results (from the past 240 hour(s))  Wet prep, genital     Status: Abnormal   Collection Time: 11/08/19 10:00 PM   Specimen: PATH Cytology Cervicovaginal Ancillary Only  Result Value  Ref Range Status   Yeast Wet Prep HPF POC NONE SEEN NONE SEEN Final   Trich, Wet Prep PRESENT (A) NONE SEEN Final   Clue Cells Wet Prep HPF POC NONE SEEN NONE SEEN Final   WBC, Wet Prep HPF POC MANY (A) NONE SEEN Final   Sperm NONE SEEN  Final    Comment: Performed at [facility], [address] [place], [place] [address]  SARS CORONAVIRUS 2 (TAT 6-24 HRS) Nasopharyngeal Nasopharyngeal Swab     Status: None   Collection Time: 11/08/19 10:32 PM   Specimen: Nasopharyngeal Swab  Result Value Ref Range Status   SARS Coronavirus 2 NEGATIVE NEGATIVE Final    Comment: (NOTE) SARS-CoV-2 target nucleic acids are NOT DETECTED. The SARS-CoV-2 RNA is generally detectable in upper and lower respiratory specimens during the acute phase of infection. Negative results do not preclude SARS-CoV-2 infection, do not rule out co-infections with other pathogens, and should not be used as the sole basis for treatment or other patient management decisions. Negative results must be combined with clinical observations, patient history, and epidemiological information. The expected result is Negative. Fact Sheet for Patients: HairSlick.nohttps://www.fda.gov/media/138098/download Fact Sheet for Healthcare Providers: quierodirigir.comhttps://www.fda.gov/media/138095/download This test is not yet approved or cleared by the [place] FDA and  has been authorized for detection and/or diagnosis of SARS-CoV-2 by FDA under an Emergency Use Authorization (EUA). This EUA will remain  in effect (meaning this test can be used) for the duration of the COVID-19 declaration under Section 56 4(b)(1) of the Act, 21 U.S.C. section 360bbb-3(b)(1), unless the authorization is terminated or revoked sooner. Performed at [facility], [address] [place], [place] [address]      Labs: BNP (last 3 results) No results for input(s): BNP in the last 8760 hours. Basic Metabolic Panel: Recent Labs  Lab 11/08/19 1949  11/09/19 1139  NA 140 140  K 3.7 4.1  CL 101 102  CO2 29 29  GLUCOSE 93 84  BUN 9 9  CREATININE 0.79 0.86  CALCIUM 9.1 9.0   Liver Function Tests: Recent Labs  Lab 11/08/19 1949  AST 38  ALT 27  ALKPHOS 56  BILITOT 0.7  PROT 7.3  ALBUMIN 3.9   Recent Labs  Lab 11/08/19 1949  LIPASE 32   No results for input(s): AMMONIA in the last 168 hours. CBC: Recent Labs  Lab 11/08/19 1949 11/09/19 1139  WBC 4.8 4.4  NEUTROABS 2.7  --   HGB 6.2* 8.1*  HCT 22.4* 28.1*  MCV 73.4* 76.6*  PLT 143* 131*   Cardiac Enzymes: No results for input(s): CKTOTAL, CKMB, CKMBINDEX, TROPONINI in the last 168 hours. BNP: Invalid input(s): POCBNP CBG: No results for input(s): GLUCAP in the last 168 hours. D-Dimer No results for input(s): DDIMER in the last 72 hours. Hgb A1c No results for input(s): HGBA1C in the last 72 hours. Lipid Profile No results for input(s): CHOL, HDL, LDLCALC, TRIG, CHOLHDL, LDLDIRECT in the last 72 hours. Thyroid function studies  No results for input(s): TSH, T4TOTAL, T3FREE, THYROIDAB in the last 72 hours.  Invalid input(s): FREET3 Anemia work up Recent Labs    11/08/19 2232  VITAMINB12 398  FOLATE 12.4  FERRITIN 3*  TIBC 561*  IRON 19*  RETICCTPCT 2.8   Urinalysis    Component Value Date/Time   COLORURINE YELLOW 11/08/2019 1547   APPEARANCEUR HAZY (A) 11/08/2019 1547   LABSPEC 1.012 11/08/2019 1547   PHURINE 5.0 11/08/2019 1547   GLUCOSEU NEGATIVE 11/08/2019 1547   HGBUR NEGATIVE 11/08/2019 1547   BILIRUBINUR NEGATIVE 11/08/2019 1547   KETONESUR NEGATIVE 11/08/2019 1547   PROTEINUR NEGATIVE 11/08/2019 1547   NITRITE NEGATIVE 11/08/2019 1547   LEUKOCYTESUR LARGE (A) 11/08/2019 1547   Sepsis Labs Invalid input(s): PROCALCITONIN,  WBC,  LACTICIDVEN Microbiology Recent Results (from the past 240 hour(s))  Wet prep, genital     Status: Abnormal   Collection Time: 11/08/19 10:00 PM   Specimen: PATH Cytology Cervicovaginal Ancillary Only   Result Value Ref Range Status   Yeast Wet Prep HPF POC NONE SEEN NONE SEEN Final   Trich, Wet Prep PRESENT (A) NONE SEEN Final   Clue Cells Wet Prep HPF POC NONE SEEN NONE SEEN Final   WBC, Wet Prep HPF POC MANY (A) NONE SEEN Final   Sperm NONE SEEN  Final    Comment: Performed at [facility], [address] [place], [place] [address]  SARS CORONAVIRUS 2 (TAT 6-24 HRS) Nasopharyngeal Nasopharyngeal Swab     Status: None   Collection Time: 11/08/19 10:32 PM   Specimen: Nasopharyngeal Swab  Result Value Ref Range Status   SARS Coronavirus 2 NEGATIVE NEGATIVE Final    Comment: (NOTE) SARS-CoV-2 target nucleic acids are NOT DETECTED. The SARS-CoV-2 RNA is generally detectable in upper and lower respiratory specimens during the acute phase of infection. Negative results do not preclude SARS-CoV-2 infection, do not rule out co-infections with other pathogens, and should not be used as the sole basis for treatment or other patient management decisions. Negative results must be combined with clinical observations, patient history, and epidemiological information. The expected result is Negative. Fact Sheet for Patients: HairSlick.no Fact Sheet for Healthcare Providers: quierodirigir.com This test is not yet approved or cleared by the [place] FDA and  has been authorized for detection and/or diagnosis of SARS-CoV-2 by FDA under an Emergency Use Authorization (EUA). This EUA will remain  in effect (meaning this test can be used) for the duration of the COVID-19 declaration under Section 56 4(b)(1) of the Act, 21 U.S.C. section 360bbb-3(b)(1), unless the authorization is terminated or revoked sooner. Performed at [facility], [address] [place], [place] [address]      Time coordinating discharge: Over 33 minutes  SIGNED:   Ky Barban, MD  [facility] 11/10/2019, 11:29  AM   If 7PM-7AM, please contact night-coverage [email or web address] Password [email or web address]

## 2021-02-19 IMAGING — US US PELVIS COMPLETE TRANSABD/TRANSVAG W DUPLEX
1 series · 13 of 25 positions shown · non-contrast
Comparison: None available.

CLINICAL DATA: Initial evaluation for anemia, vaginal bleeding for
2 weeks.

EXAM:
TRANSABDOMINAL AND TRANSVAGINAL ULTRASOUND OF PELVIS
DOPPLER ULTRASOUND OF OVARIES
TECHNIQUE: Both transabdominal and transvaginal ultrasound examinations of the
pelvis were performed. Transabdominal technique was performed for
global imaging of the pelvis including uterus, ovaries, adnexal
regions, and pelvic cul-de-sac.
It was necessary to proceed with endovaginal exam following the
transabdominal exam to visualize the uterus, endometrium, and
ovaries. Color and duplex Doppler ultrasound was utilized to
evaluate blood flow to the ovaries.

[Series 1: us pelvis complete transabd/transvag w duplex · 13 of 63 slices shown]
[im 1/63]
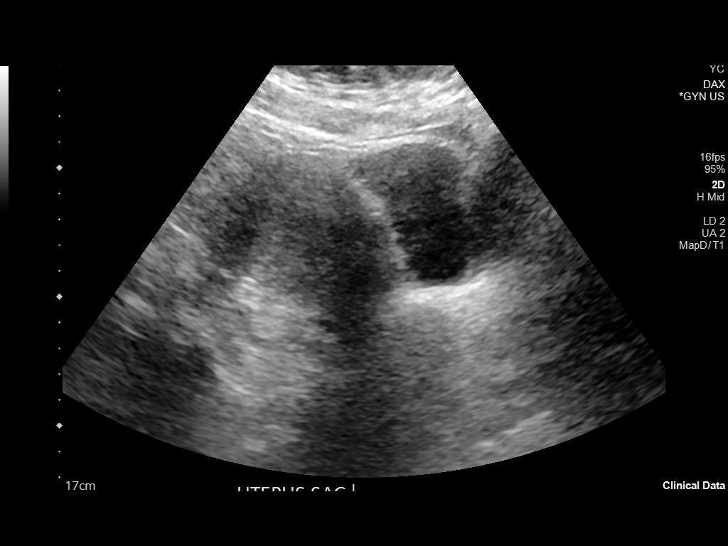
[im 6/63]
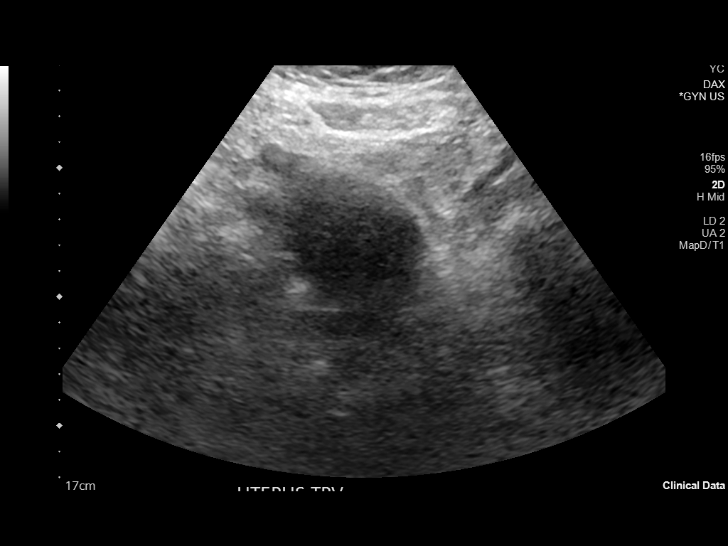
[im 11/63]
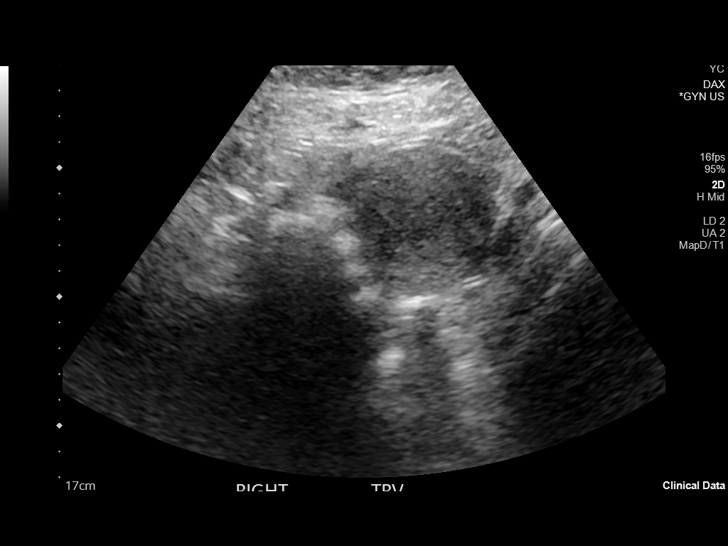
[im 16/63]
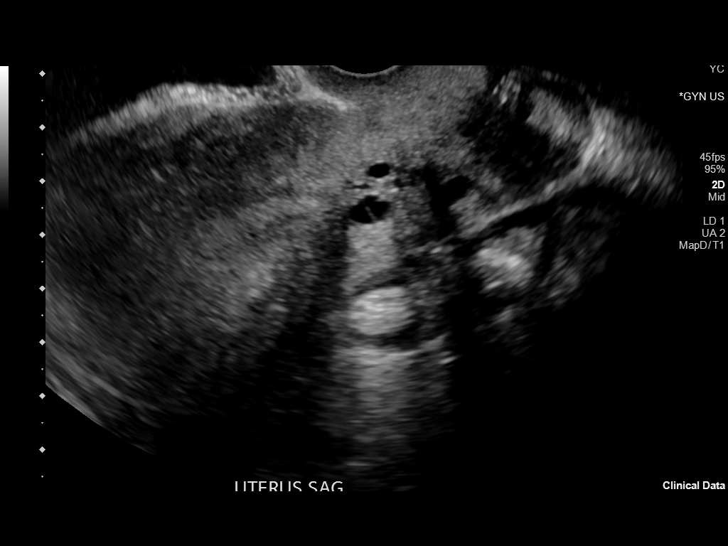
[im 21/63]
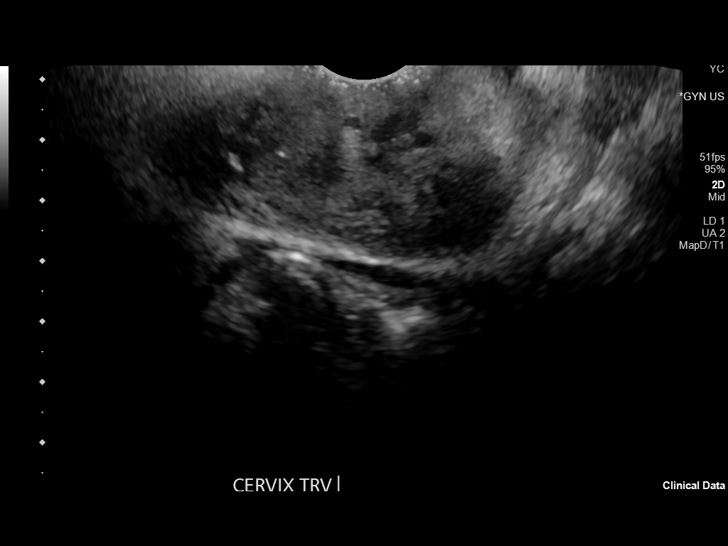
[im 26/63]
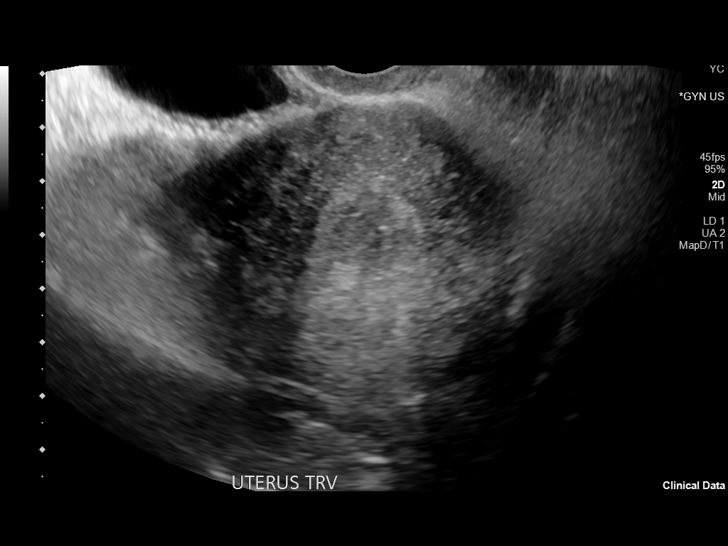
[im 32/63]
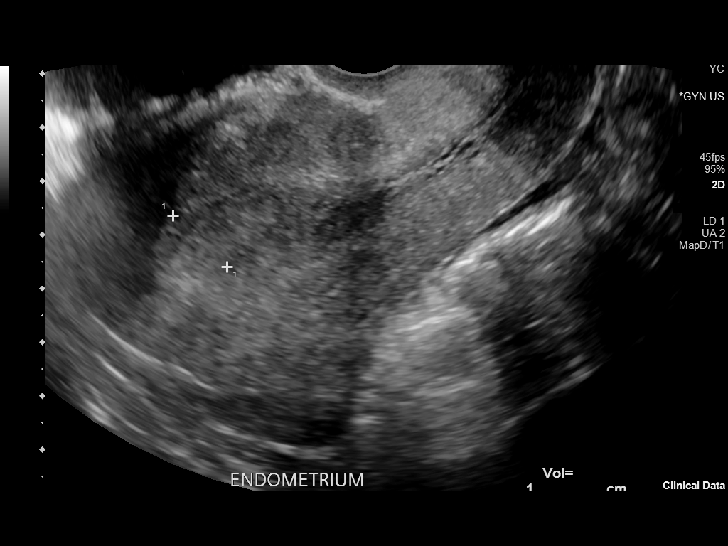
[im 37/63]
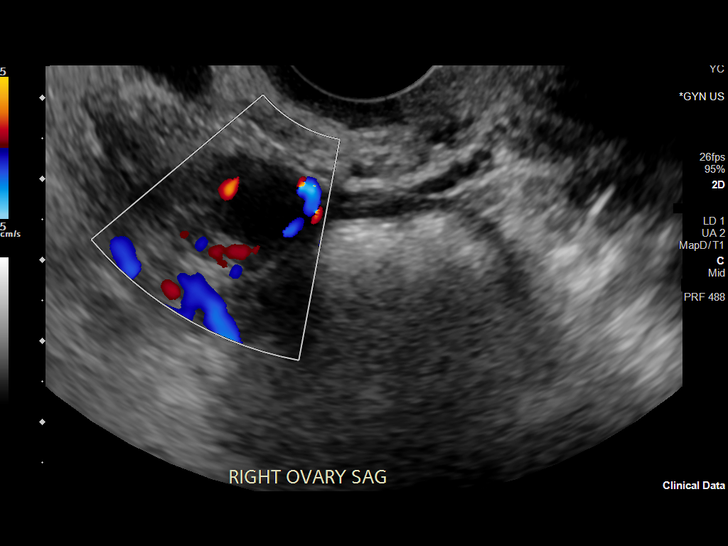
[im 42/63]
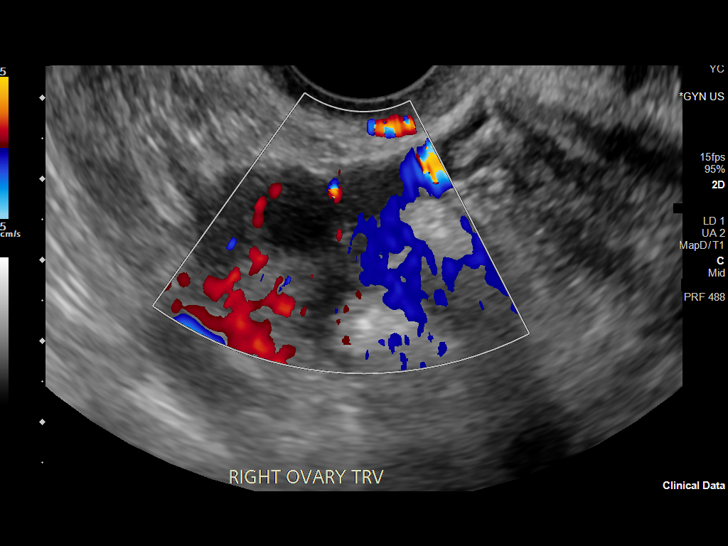
[im 47/63]
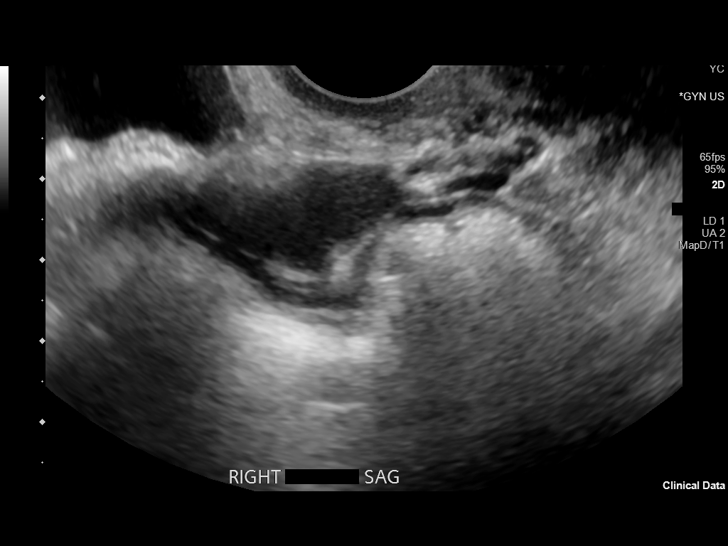
[im 52/63]
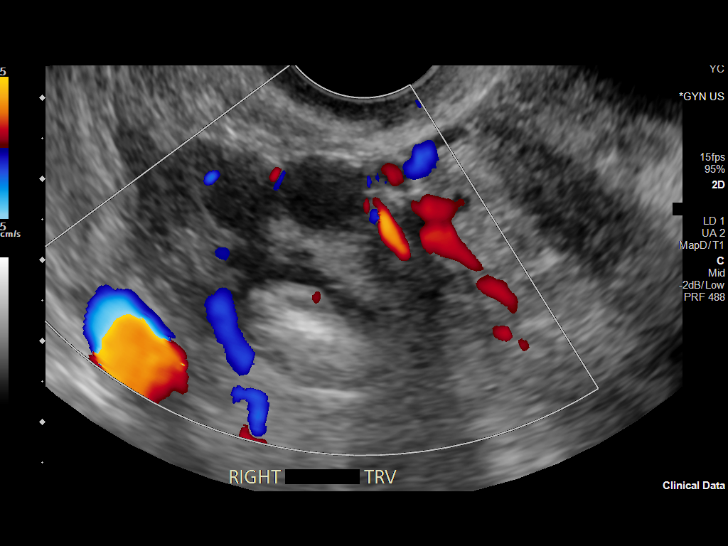
[im 57/63]
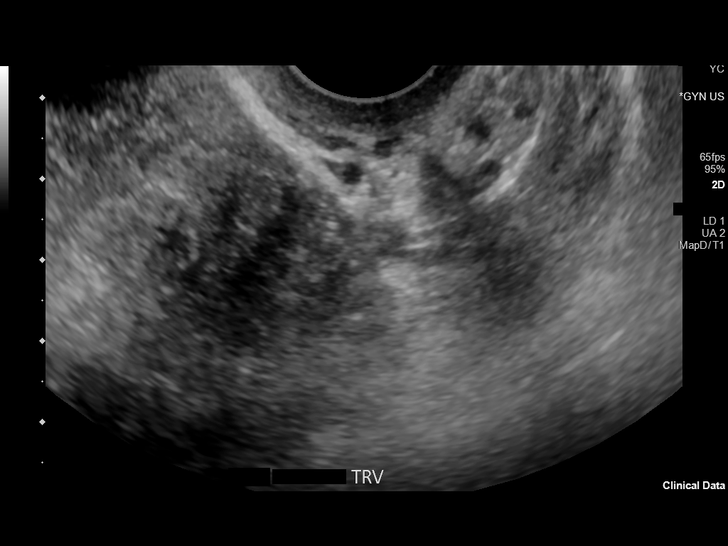
[im 63/63]
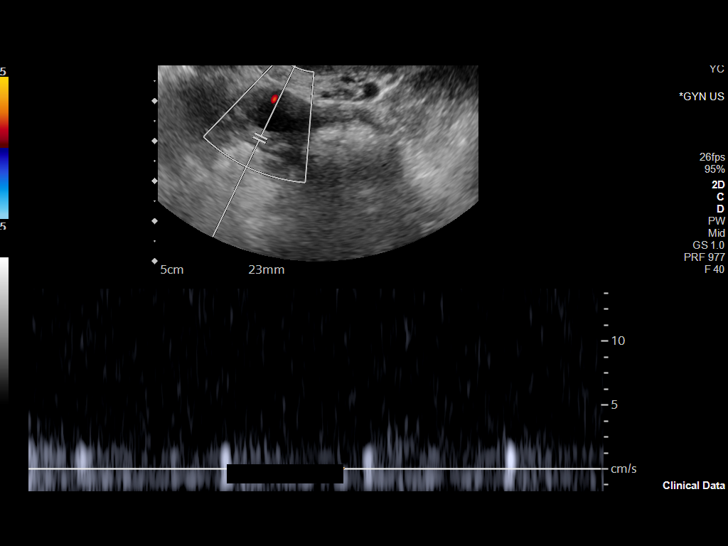

[13 of 25 positions shown; findings below may reference images not displayed]

FINDINGS: Uterus

Measurements: 11.1 x 5.5 x 6.1 cm = volume: 194.5 mL. No fibroids or
other mass visualized.

Endometrium

Thickness: 13.9 mm. Endometrial complex is somewhat heterogeneous in
appearance without discrete or focal mass.

Right ovary

Measurements: 1.8 x 1.6 x 1.8 cm = volume: 2.7 mL. 3.1 x 1.4 x
cm somewhat tubular anechoic structure within the right adnexa is
seen, suspected to reflect a hydrosalpinx.

Left ovary

Not visualized.  No adnexal mass.

Pulsed Doppler evaluation of the right ovary demonstrates normal
low-resistance arterial and venous waveforms.

Other findings

Trace free fluid seen within the pelvis, presumably physiologic.
IMPRESSION: 1. Endometrial stripe measures 13.9 mm in thickness. If bleeding
remains unresponsive to hormonal or medical therapy, sonohysterogram
should be considered for focal lesion work-up. (Ref: Radiological
Reasoning: Algorithmic Workup of Abnormal Vaginal Bleeding with
Endovaginal Sonography and Sonohysterography. AJR 0990; 191:S68-73).
2. Otherwise normal sonographic appearance of the uterus. No
discrete fibroid or other abnormality.
3. 3.1 cm tubular cystic structure within the right adnexa, most
characteristic of a small hydrosalpinx.
4. Otherwise normal right ovary.  No evidence for torsion.
5. Nonvisualization of the left ovary.  No left adnexal mass.

## 2024-05-26 DEATH — deceased
